# Patient Record
Sex: Female | Born: 1970 | Race: White | Hispanic: No | Smoking: Current every day smoker
Health system: Southern US, Community
[De-identification: ages and names within clinical notes are randomized; demographics above are authoritative.]

## PROBLEM LIST (undated history)

## (undated) DIAGNOSIS — D320 Benign neoplasm of cerebral meninges: Secondary | ICD-10-CM

## (undated) DIAGNOSIS — M549 Dorsalgia, unspecified: Secondary | ICD-10-CM

## (undated) DIAGNOSIS — M25529 Pain in unspecified elbow: Secondary | ICD-10-CM

## (undated) DIAGNOSIS — Z975 Presence of (intrauterine) contraceptive device: Secondary | ICD-10-CM

## (undated) DIAGNOSIS — E119 Type 2 diabetes mellitus without complications: Secondary | ICD-10-CM

## (undated) DIAGNOSIS — M51369 Other intervertebral disc degeneration, lumbar region without mention of lumbar back pain or lower extremity pain: Secondary | ICD-10-CM

## (undated) DIAGNOSIS — G43909 Migraine, unspecified, not intractable, without status migrainosus: Secondary | ICD-10-CM

## (undated) DIAGNOSIS — E785 Hyperlipidemia, unspecified: Secondary | ICD-10-CM

## (undated) DIAGNOSIS — M722 Plantar fascial fibromatosis: Secondary | ICD-10-CM

## (undated) HISTORY — PX: APPENDECTOMY: SHX54

## (undated) HISTORY — PX: CHOLECYSTECTOMY: SHX55

## (undated) HISTORY — PX: KNEE SURGERY: SHX244

## (undated) HISTORY — PX: BREAST SURGERY: SHX581

---

## 2017-07-11 ENCOUNTER — Emergency Department (INDEPENDENT_AMBULATORY_CARE_PROVIDER_SITE_OTHER)
Admission: EM | Admit: 2017-07-11 | Discharge: 2017-07-11 | Disposition: A | Discharge status: Home / Self Care | Payer: BLUE CROSS/BLUE SHIELD | Source: Home / Self Care | Attending: Family Medicine | Admitting: Family Medicine

## 2017-07-11 ENCOUNTER — Emergency Department (INDEPENDENT_AMBULATORY_CARE_PROVIDER_SITE_OTHER): Payer: BLUE CROSS/BLUE SHIELD

## 2017-07-11 ENCOUNTER — Encounter: Payer: Self-pay | Admitting: *Deleted

## 2017-07-11 ENCOUNTER — Other Ambulatory Visit: Payer: Self-pay

## 2017-07-11 DIAGNOSIS — B9789 Other viral agents as the cause of diseases classified elsewhere: Secondary | ICD-10-CM

## 2017-07-11 DIAGNOSIS — J069 Acute upper respiratory infection, unspecified: Secondary | ICD-10-CM | POA: Diagnosis not present

## 2017-07-11 DIAGNOSIS — R079 Chest pain, unspecified: Secondary | ICD-10-CM

## 2017-07-11 DIAGNOSIS — R05 Cough: Secondary | ICD-10-CM | POA: Diagnosis not present

## 2017-07-11 DIAGNOSIS — J9801 Acute bronchospasm: Secondary | ICD-10-CM

## 2017-07-11 DIAGNOSIS — M940 Chondrocostal junction syndrome [Tietze]: Secondary | ICD-10-CM

## 2017-07-11 HISTORY — DX: Hyperlipidemia, unspecified: E78.5

## 2017-07-11 MED ORDER — DOXYCYCLINE HYCLATE 100 MG PO CAPS: 100.0000 mg | ORAL_CAPSULE | Freq: Two times a day (BID) | ORAL | 0 refills | Status: DC

## 2017-07-11 MED ORDER — BENZONATATE 200 MG PO CAPS: ORAL_CAPSULE | ORAL | 0 refills | Status: DC

## 2017-07-11 MED ORDER — PREDNISONE 20 MG PO TABS: ORAL_TABLET | ORAL | 0 refills | Status: DC

## 2017-07-11 NOTE — ED Triage Notes (Signed)
Pt reports that she had URI at the beginning of October with fever, cough, and chills. Her s/s resolved except a persistent cough. She now c/o chest pain x 2 days.

## 2017-07-11 NOTE — Discharge Instructions (Signed)
Take plain guaifenesin (1200mg  extended release tabs such as Mucinex) twice daily, with plenty of water, for cough and congestion.  May add Pseudoephedrine (30mg , one or two every 4 to 6 hours) for sinus congestion.  Get adequate rest.   Try warm salt water gargles for sore throat.  Stop all antihistamines for now, and other non-prescription cough/cold preparations. May continue albuterol if needed for wheezing. Follow-up with family doctor if not improving about 7 to 10 days.

## 2017-07-11 NOTE — ED Provider Notes (Signed)
[facility]    CSN: [id number] Arrival date & time: 07/11/17  1749     History   Chief Complaint Chief Complaint  Patient presents with  . Cough    HPI Tracey Little is a 46 y.o. female.   Patient reports that she developed a flu-like respiratory illness about five weeks ago, treated initially with amoxicillin and then changed to azithromycin about two weeks ago.  She was feeling somewhat better until 4 days ago when she developed increased fatigue, sinus congestion, sore throat, chills, and soreness in her neck.  During the past week she has developed tightness in her anterior chest, and intermittent wheezing.  She has been using an albuterol inhaler with improvement in wheezing.  She has a family history of asthma (mother and brother).  She notes that her colds tend to linger.   The history is provided by the patient.    Past Medical History:  Diagnosis Date  . Hyperlipidemia     There are no active problems to display for this patient.   Past Surgical History:  Procedure Laterality Date  . APPENDECTOMY    . BREAST SURGERY    . CHOLECYSTECTOMY    . KNEE SURGERY      OB History    No data available       Home Medications    Prior to Admission medications   Medication Sig Start Date End Date Taking? Authorizing Provider  aspirin 81 MG chewable tablet Chew daily by mouth.   Yes [provider]  cholecalciferol (VITAMIN D) 1000 units tablet Take 1,000 Units daily by mouth.   Yes [provider]  clonazePAM (KLONOPIN) 1 MG tablet Take 1 mg 2 (two) times daily by mouth.   Yes [provider]  DULoxetine HCl (CYMBALTA PO) Take by mouth.   Yes [provider]  ibuprofen (ADVIL,MOTRIN) 200 MG tablet Take 200 mg every 6 (six) hours as needed by mouth.   Yes [provider]  Lansoprazole (PREVACID PO) Take by mouth.   Yes [provider]  vortioxetine HBr (TRINTELLIX) 20 MG TABS Take by mouth.   Yes  [provider]  benzonatate (TESSALON) 200 MG capsule Take one cap by mouth at bedtime as needed for cough.  May repeat in 4 to 6 hours 07/11/17   Lattie HawBeese, Shantanu Strauch A, MD  doxycycline (VIBRAMYCIN) 100 MG capsule Take 1 capsule (100 mg total) 2 (two) times daily by mouth. Take with food. 07/11/17   Lattie HawBeese, Teofilo Lupinacci A, MD  predniSONE (DELTASONE) 20 MG tablet Take one tab by mouth twice daily for 5 days, then one daily. Take with food. 07/11/17   Lattie HawBeese, Moorea Boissonneault A, MD    Family History Family History  Problem Relation Age of Onset  . Diabetes Mother   . Hyperlipidemia Mother   . Hypertension Mother   . Diabetes Father   . Diabetes Sister   . Hyperlipidemia Sister   . Hypertension Sister   . Diabetes Brother   . Hyperlipidemia Brother   . Hypertension Brother     Social History Social History   Tobacco Use  . Smoking status: Current Every Day Smoker    Packs/day: 1.00    Types: Cigarettes  . Smokeless tobacco: Never Used  Substance Use Topics  . Alcohol use: No    Frequency: Never  . Drug use: No     Allergies   Patient has no known allergies.   Review of Systems Review of Systems + sore throat +  cough No pleuritic pain, but complains of pain over sternum + wheezing + nasal congestion + post-nasal drainage No sinus pain/pressure No itchy/red eyes No earache No hemoptysis No SOB No fever, + chills No nausea No vomiting No abdominal pain No diarrhea No urinary symptoms No skin rash + fatigue No myalgias No headache Used OTC meds without relief   Physical Exam Triage Vital Signs ED Triage Vitals  Enc Vitals Group     BP 07/11/17 1817 134/84     Pulse Rate 07/11/17 1817 73     Resp 07/11/17 1817 18     Temp 07/11/17 1817 98.6 F (37 C)     Temp Source 07/11/17 1817 Oral     SpO2 07/11/17 1817 100 %     Weight 07/11/17 1818 188 lb (85.3 kg)     Height 07/11/17 1818  (1.651 m)     Head Circumference --      Peak Flow --      Pain Score  07/11/17 1818 0     Pain Loc --      Pain Edu? --      Excl. in GC? --    No data found.  Updated Vital Signs BP 134/84 (BP Location: Right Arm)   Pulse 73   Temp 98.6 F (37 C) (Oral)   Resp 18   Ht  (1.651 m)   Wt 188 lb (85.3 kg)   SpO2 100%   BMI 31.28 kg/m   Visual Acuity Right Eye Distance:   Left Eye Distance:   Bilateral Distance:    Right Eye Near:   Left Eye Near:    Bilateral Near:     Physical Exam Nursing notes and Vital Signs reviewed. Appearance:  Patient appears stated age, and in no acute distress Eyes:  Pupils are equal, round, and reactive to light and accomodation.  Extraocular movement is intact.  Conjunctivae are not inflamed  Ears:  Canals normal.  Tympanic membranes normal.  Nose:  Mildly congested turbinates.  No sinus tenderness.  Pharynx:  Normal Neck:  Supple.  Enlarged posterior/lateral nodes are palpated bilaterally, tender to palpation on the left.   Lungs:  Clear to auscultation.  Breath sounds are equal.  Moving air well. Chest:  Distinct tenderness to palpation over the mid-sternum.  Heart:  Regular rate and rhythm without murmurs, rubs, or gallops.  Abdomen:  Nontender without masses or hepatosplenomegaly.  Bowel sounds are present.  No CVA or flank tenderness.  Extremities:  No edema.  Skin:  No rash present.    UC Treatments / Results  Labs (all labs ordered are listed, but only abnormal results are displayed) Labs Reviewed - No data to display  EKG  EKG Interpretation None       Radiology Dg Chest 2 View  Result Date: 07/11/2017 CLINICAL DATA:  Cough for 1 month and chest pain for 2 days. EXAM: CHEST  2 VIEW COMPARISON:  None. FINDINGS: Cardiomediastinal silhouette is normal. Mediastinal contours appear intact. There is no evidence of focal airspace consolidation, pleural effusion or pneumothorax. Osseous structures are without acute abnormality. Soft tissues are grossly normal. IMPRESSION: No active cardiopulmonary  disease. Electronically Signed   By: Ted Mcalpine M.D.   On: 07/11/2017 18:29    Procedures Procedures (including critical care time)  Medications Ordered in UC Medications - No data to display   Initial Impression / Assessment and Plan / UC Course  I have reviewed the triage vital signs and the  nursing notes.  Pertinent labs & imaging results that were available during my care of the patient were reviewed by me and considered in my medical decision making (see chart for details).    With patient's history of seasonal rhinitis, family history of asthma, and past history of viral URI's that linger, she probably has a predisposition to mild reactive airways disease.  She now has congestion of a viral URI superimposed on her usual seasonal rhinitis.    Suspect present illness is a second viral URI Begin doxycycline for atypical coverage. Begin prednisone burst. Prescription written for Benzonatate (Tessalon) to take at bedtime for night-time cough.  Take plain guaifenesin (  extended release tabs such as Mucinex) twice daily, with plenty of water, for cough and congestion.  May add Pseudoephedrine ( , one or two every 4 to 6 hours) for sinus congestion.  Get adequate rest.   Try warm salt water gargles for sore throat.  Stop all antihistamines for now, and other non-prescription cough/cold preparations. May continue albuterol if needed for wheezing. Follow-up with family doctor if not improving about 7 to 10 days.     Final Clinical Impressions(s) / UC Diagnoses   Final diagnoses:  Viral URI with cough  Bronchospasm  Costochondritis    ED Discharge Orders        Ordered    predniSONE (DELTASONE) 20 MG tablet     07/11/17 1903    doxycycline (VIBRAMYCIN) 100 MG capsule  2 times daily     07/11/17 1903    benzonatate (TESSALON) 200 MG capsule     07/11/17 1903           Lattie Haw, MD 07/14/17 1124

## 2018-06-06 IMAGING — DX DG CHEST 2V
2 series · 2 of 2 positions shown · non-contrast
Comparison: None.

CLINICAL DATA: Cough for 1 month and chest pain for 2 days.

EXAM:
CHEST  2 VIEW

[chest pa]
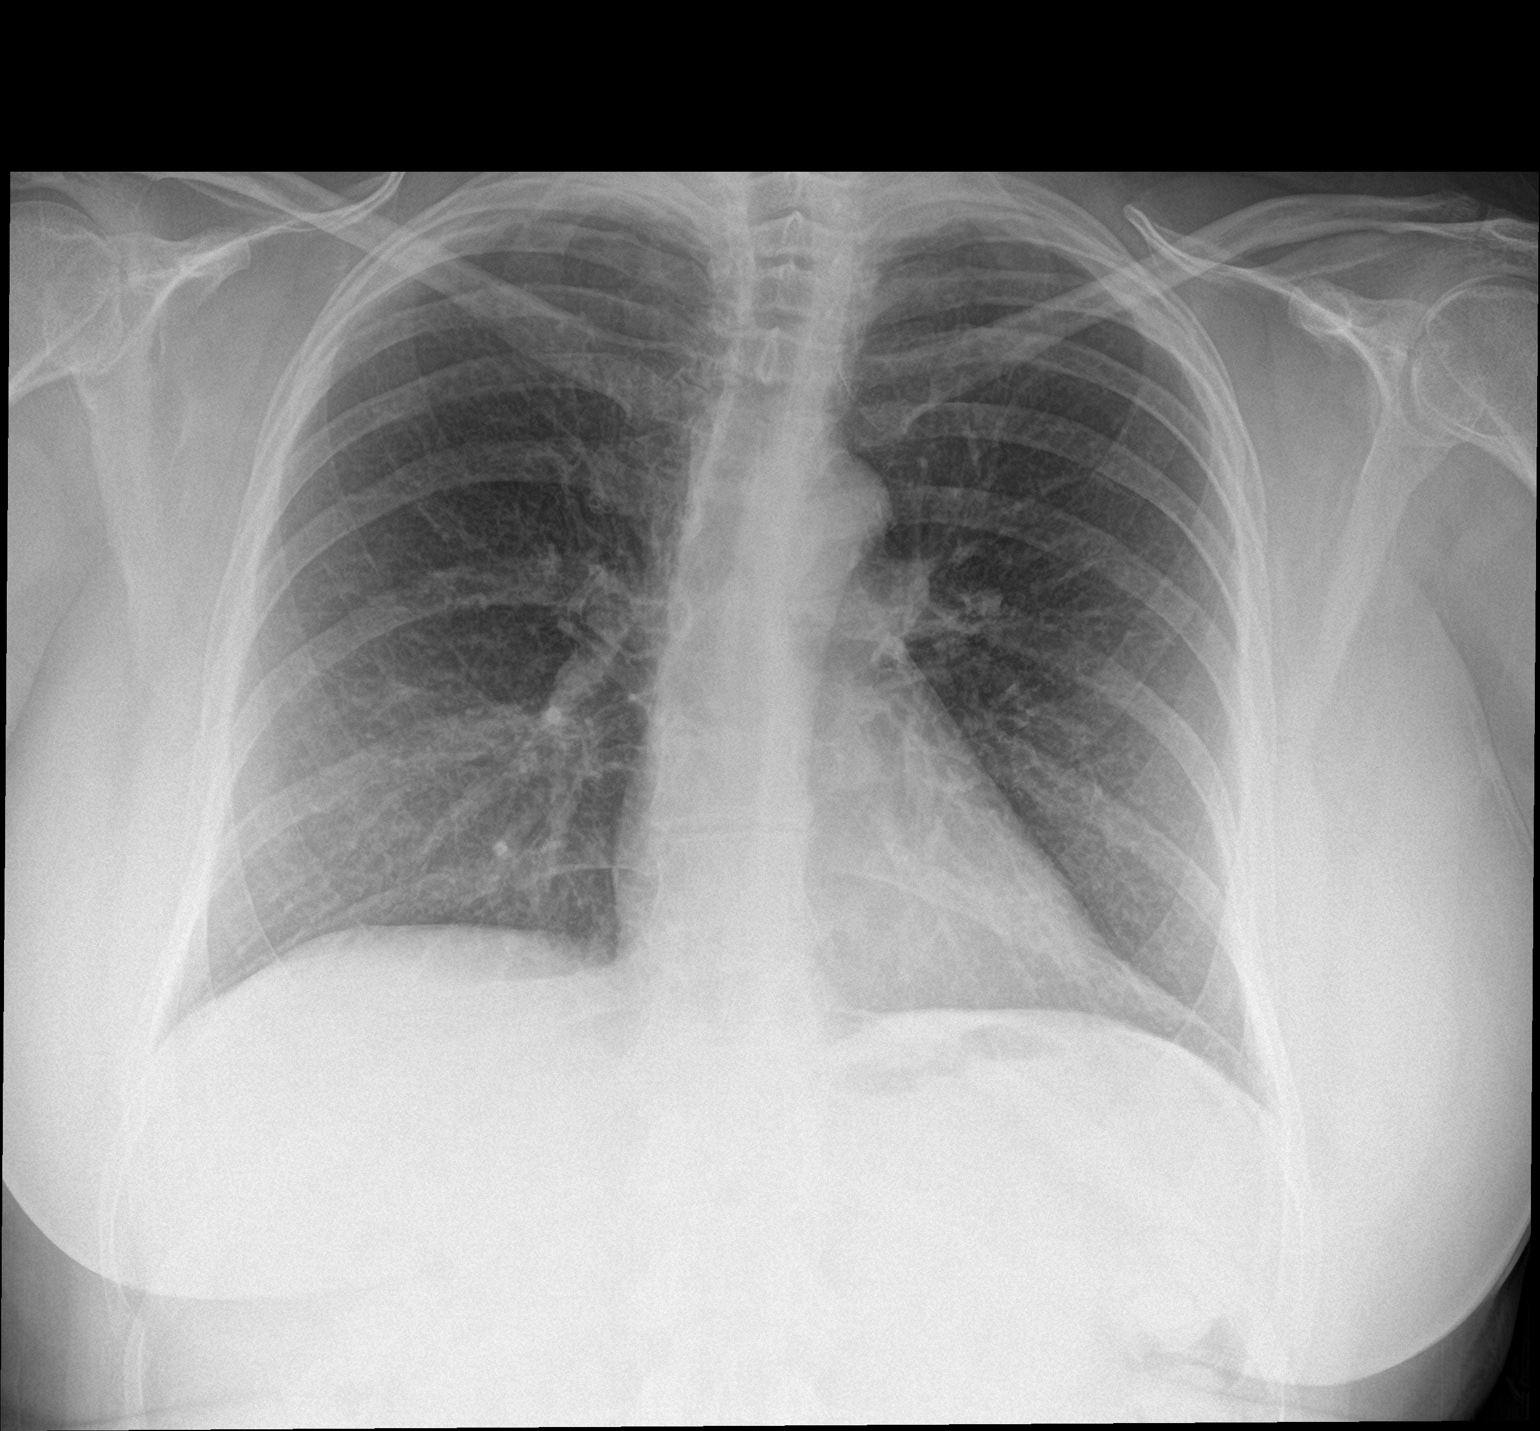

[chest lat]
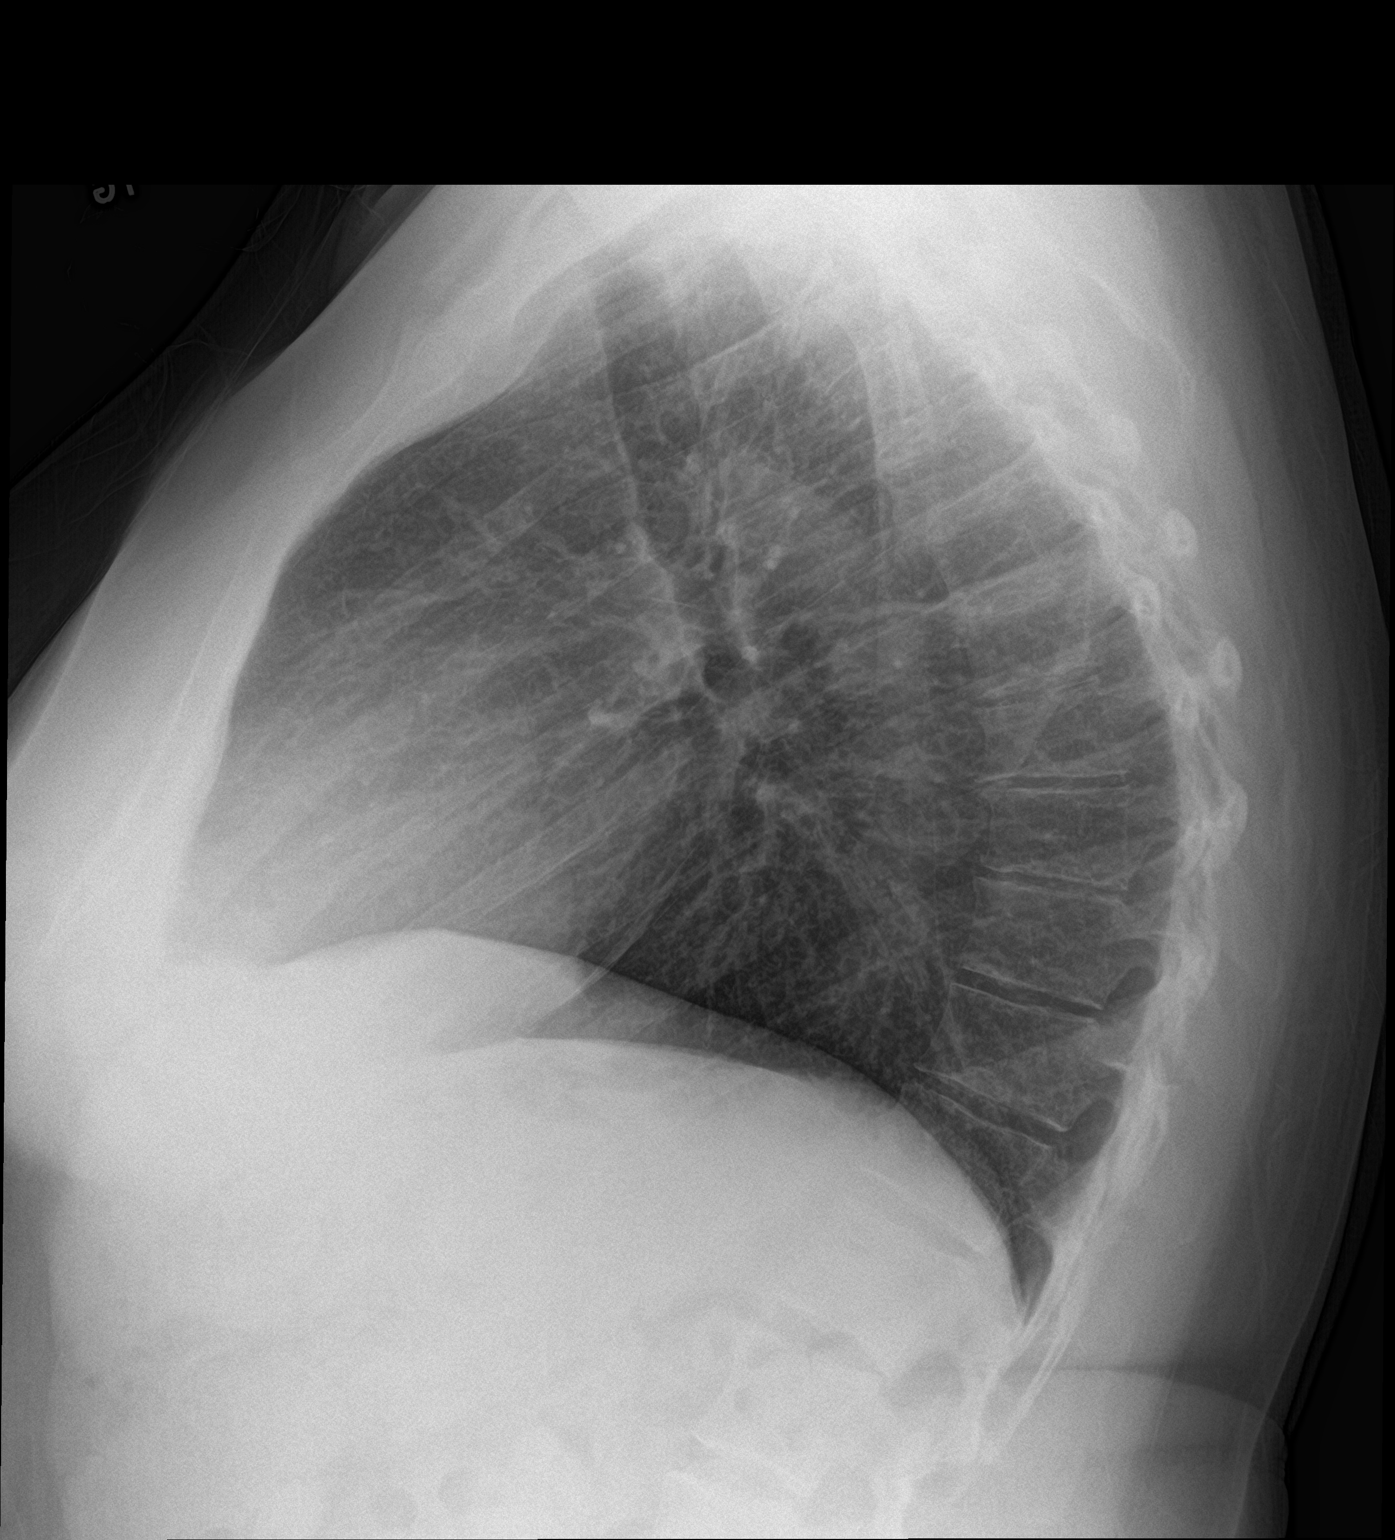

[2 of 2 positions shown; findings below may reference images not displayed]

FINDINGS: Cardiomediastinal silhouette is normal. Mediastinal contours appear
intact.

There is no evidence of focal airspace consolidation, pleural
effusion or pneumothorax.

Osseous structures are without acute abnormality. Soft tissues are
grossly normal.
IMPRESSION: No active cardiopulmonary disease.

## 2020-03-09 ENCOUNTER — Emergency Department (INDEPENDENT_AMBULATORY_CARE_PROVIDER_SITE_OTHER)
Admission: RE | Admit: 2020-03-09 | Discharge: 2020-03-09 | Disposition: A | Discharge status: Home / Self Care | Payer: BC Managed Care – PPO | Source: Ambulatory Visit

## 2020-03-09 ENCOUNTER — Other Ambulatory Visit: Payer: Self-pay

## 2020-03-09 ENCOUNTER — Emergency Department
Admission: EM | Admit: 2020-03-09 | Discharge: 2020-03-09 | Discharge status: Absent without leave | Payer: BLUE CROSS/BLUE SHIELD | Source: Home / Self Care

## 2020-03-09 VITALS — BP 106/72 | HR 88 | Temp 98.6°F | Resp 16

## 2020-03-09 DIAGNOSIS — M545 Low back pain, unspecified: Secondary | ICD-10-CM

## 2020-03-09 LAB — POCT URINALYSIS DIP (MANUAL ENTRY)
Bilirubin, UA: NEGATIVE
Glucose, UA: NEGATIVE mg/dL
Ketones, POC UA: NEGATIVE mg/dL
Leukocytes, UA: NEGATIVE
Nitrite, UA: NEGATIVE
Protein Ur, POC: NEGATIVE mg/dL
Spec Grav, UA: >=1.03 — AB (ref 1.010–1.025)
Urobilinogen, UA: 0.2 E.U./dL
pH, UA: 6.5 (ref 5.0–8.0)

## 2020-03-09 MED ORDER — CYCLOBENZAPRINE HCL 5 MG PO TABS: 5.0000 mg | ORAL_TABLET | Freq: Two times a day (BID) | ORAL | 0 refills | Status: DC | PRN

## 2020-03-09 MED ORDER — HYDROCODONE-ACETAMINOPHEN 5-325 MG PO TABS: 1.0000 | ORAL_TABLET | Freq: Three times a day (TID) | ORAL | 0 refills | Status: DC | PRN

## 2020-03-09 MED ORDER — PREDNISONE 50 MG PO TABS: 50.0000 mg | ORAL_TABLET | Freq: Every day | ORAL | 0 refills | Status: AC

## 2020-03-09 MED ORDER — METHYLPREDNISOLONE SODIUM SUCC 40 MG IJ SOLR
80.0000 mg | Freq: Once | INTRAMUSCULAR | Status: AC
  Administered 2020-03-09: 15:00:00 80 mg via INTRAMUSCULAR

## 2020-03-09 NOTE — Discharge Instructions (Signed)
  You were given a shot of solumedrol (a steroid) today to help with muscle pain and swelling.  You have been prescribed prednisone, an oral steroid.  You may start this medication tomorrow with breakfast.    Norco/Vicodin (hydrocodone-acetaminophen) is a narcotic pain medication, do not combine these medications with others containing tylenol. While taking, do not drink alcohol, drive, or perform any other activities that requires focus while taking these medications.   Call to schedule a follow up appointment with family medicine or sports medicine later this week if not improving.

## 2020-03-09 NOTE — ED Provider Notes (Signed)
[facility]    CSN: [id number] Arrival date & time: 03/09/20  1444      History   Chief Complaint Chief Complaint  Patient presents with   Appointment    3:00   Back Pain    HPI Tracey Little is a 49 y.o. female.   HPI Tracey Little is a 49 y.o. female presenting to UC with c/o bilateral low back pain that started yesterday with tingling up her spine, the tingling has resolved but she woke with low back pain that is aching and sharp, 6/10, worse with certain movements. Pain radiating into lower abdomen with cramping. No recent injuries but does report sleeping "a lot" the other day.  She also reports hx of bulging discs.  She has not had back problems for about 5-6 years.  Denies radiation of pain or numbness in arms or legs. No urinary symptoms. No hx of kidney stones.    Past Medical History:  Diagnosis Date   Hyperlipidemia     There are no problems to display for this patient.   Past Surgical History:  Procedure Laterality Date   APPENDECTOMY     BREAST SURGERY     CHOLECYSTECTOMY     KNEE SURGERY      OB History   No obstetric history on file.      Home Medications    Prior to Admission medications   Medication Sig Start Date End Date Taking? Authorizing Provider  atorvastatin (LIPITOR) 20 MG tablet Take 20 mg by mouth daily.   Yes [provider]  ibuprofen (ADVIL,MOTRIN) 200 MG tablet Take 200 mg every 6 (six) hours as needed by mouth.   Yes [provider]  sertraline (ZOLOFT) 50 MG tablet Take 50 mg by mouth daily.   Yes [provider]  aspirin 81 MG chewable tablet Chew daily by mouth.    [provider]  benzonatate (TESSALON) 200 MG capsule Take one cap by mouth at bedtime as needed for cough.  May repeat in 4 to 6 hours 07/11/17   Lattie Haw, MD  cholecalciferol (VITAMIN D) 1000 units tablet Take 1,000 Units daily by mouth.    [provider]  clonazePAM (KLONOPIN) 1 MG  tablet Take 1 mg 2 (two) times daily by mouth.    [provider]  cyclobenzaprine (FLEXERIL) 5 MG tablet Take 1-2 tablets (5-10 mg total) by mouth 2 (two) times daily as needed for muscle spasms. 03/09/20   Lurene Shadow, PA-C  doxycycline (VIBRAMYCIN) 100 MG capsule Take 1 capsule (100 mg total) 2 (two) times daily by mouth. Take with food. 07/11/17   Lattie Haw, MD  DULoxetine HCl (CYMBALTA PO) Take by mouth.    [provider]  HYDROcodone-acetaminophen (NORCO/VICODIN) 5-325 MG tablet Take 1 tablet by mouth every 8 (eight) hours as needed. 03/09/20   Lurene Shadow, PA-C  Lansoprazole (PREVACID PO) Take by mouth.    [provider]  predniSONE (DELTASONE) 50 MG tablet Take 1 tablet (50 mg total) by mouth daily with breakfast for 5 days. 03/09/20 03/14/20  Lurene Shadow, PA-C  vortioxetine HBr (TRINTELLIX) 20 MG TABS Take by mouth.    [provider]    Family History Family History  Problem Relation Age of Onset   Diabetes Mother    Hyperlipidemia Mother    Hypertension Mother    Diabetes Father    Heart failure Father    Heart attack Father    Diabetes Sister  Hyperlipidemia Sister    Hypertension Sister    Diabetes Brother    Hyperlipidemia Brother    Hypertension Brother     Social History Social History   Tobacco Use   Smoking status: Current Every Day Smoker    Packs/day: 1.00    Types: Cigarettes   Smokeless tobacco: Never Used  Vaping Use   Vaping Use: Never used  Substance Use Topics   Alcohol use: No   Drug use: No     Allergies   Patient has no known allergies.   Review of Systems Review of Systems  Gastrointestinal: Negative for diarrhea, nausea and vomiting.  Genitourinary: Negative for dysuria, frequency and hematuria.  Musculoskeletal: Positive for back pain and myalgias.     Physical Exam Triage Vital Signs ED Triage Vitals  Enc Vitals Group     BP 03/09/20 1457 106/72     Pulse Rate  03/09/20 1457 88     Resp 03/09/20 1457 16     Temp 03/09/20 1457 98.6 F (37 C)     Temp Source 03/09/20 1457 Oral     SpO2 03/09/20 1457 97 %     Weight --      Height --      Head Circumference --      Peak Flow --      Pain Score 03/09/20 1454 6     Pain Loc --      Pain Edu? --      Excl. in GC? --    No data found.  Updated Vital Signs BP 106/72 (BP Location: Right Arm)    Pulse 88    Temp 98.6 F (37 C) (Oral)    Resp 16    SpO2 97%   Visual Acuity Right Eye Distance:   Left Eye Distance:   Bilateral Distance:    Right Eye Near:   Left Eye Near:    Bilateral Near:     Physical Exam Vitals and nursing note reviewed.  Constitutional:      Appearance: Normal appearance. She is well-developed.  HENT:     Head: Normocephalic and atraumatic.  Cardiovascular:     Rate and Rhythm: Normal rate and regular rhythm.  Pulmonary:     Effort: Pulmonary effort is normal. No respiratory distress.     Breath sounds: Normal breath sounds.  Musculoskeletal:        General: Tenderness present. Normal range of motion.     Cervical back: Normal range of motion.       Back:     Comments: Full ROM upper and lower extremities.  Negative straight leg raise. 5/5 strength in lower extremities bilaterally. Increased pain with rotation and flexion of the hip. Normal gait.   Skin:    General: Skin is warm and dry.  Neurological:     Mental Status: She is alert and oriented to person, place, and time.  Psychiatric:        Behavior: Behavior normal.      UC Treatments / Results  Labs (all labs ordered are listed, but only abnormal results are displayed) Labs Reviewed  POCT URINALYSIS DIP (MANUAL ENTRY) - Abnormal; Notable for the following components:      Result Value   Clarity, UA cloudy (*)    Spec Grav, UA >=1.030 (*)    Blood, UA trace-lysed (*)    All other components within normal limits    EKG   Radiology No results found.  Procedures Procedures (including  critical  care time)  Medications Ordered in UC Medications  methylPREDNISolone sodium succinate (SOLU-MEDROL) 40 mg/mL injection 80 mg (80 mg Intramuscular Given 03/09/20 1522)    Initial Impression / Assessment and Plan / UC Course  I have reviewed the triage vital signs and the nursing notes.  Pertinent labs & imaging results that were available during my care of the patient were reviewed by me and considered in my medical decision making (see chart for details).     Muscular tenderness on exam. No red flag symptoms No reports of trauma Will hold off on urgent imaging and tx conservatively  Encouraged f/u with PCP or Sports Medicine later this week if not improving AVS given   Final Clinical Impressions(s) / UC Diagnoses   Final diagnoses:  Acute bilateral low back pain without sciatica     Discharge Instructions      You were given a shot of solumedrol (a steroid) today to help with muscle pain and swelling.  You have been prescribed prednisone, an oral steroid.  You may start this medication tomorrow with breakfast.    Norco/Vicodin (hydrocodone-acetaminophen) is a narcotic pain medication, do not combine these medications with others containing tylenol. While taking, do not drink alcohol, drive, or perform any other activities that requires focus while taking these medications.   Call to schedule a follow up appointment with family medicine or sports medicine later this week if not improving.     ED Prescriptions    Medication Sig Dispense Auth. Provider   cyclobenzaprine (FLEXERIL) 5 MG tablet Take 1-2 tablets (5-10 mg total) by mouth 2 (two) times daily as needed for muscle spasms. 30 tablet Doroteo Glassman, Audriana Aldama O, PA-C   predniSONE (DELTASONE) 50 MG tablet Take 1 tablet (50 mg total) by mouth daily with breakfast for 5 days. 5 tablet Lurene Shadow, PA-C   HYDROcodone-acetaminophen (NORCO/VICODIN) 5-325 MG tablet Take 1 tablet by mouth every 8 (eight) hours as needed. 6  tablet Lurene Shadow, [place]     I have reviewed the PDMP during this encounter.   Lurene Shadow, [place] 03/09/20 [phone]

## 2020-03-09 NOTE — ED Triage Notes (Signed)
Patient presents to Urgent Care with complaints of lower back pain that radiates tingles up her back since yesterday. Patient reports the tingling has gone away and now she has lowe abdominal cramping near her bladder. Pt states she has bulging discs, has not had problems w/ it in years.

## 2022-01-23 ENCOUNTER — Emergency Department
Admission: RE | Admit: 2022-01-23 | Discharge: 2022-01-23 | Disposition: A | Discharge status: Home / Self Care | Payer: BC Managed Care – PPO | Source: Ambulatory Visit

## 2022-01-23 VITALS — BP 123/84 | HR 76 | Temp 98.5°F | Resp 16

## 2022-01-23 DIAGNOSIS — J309 Allergic rhinitis, unspecified: Secondary | ICD-10-CM | POA: Diagnosis not present

## 2022-01-23 DIAGNOSIS — R059 Cough, unspecified: Secondary | ICD-10-CM

## 2022-01-23 MED ORDER — FEXOFENADINE HCL 180 MG PO TABS: 180.0000 mg | ORAL_TABLET | Freq: Every day | ORAL | 0 refills | Status: DC

## 2022-01-23 MED ORDER — PROMETHAZINE-DM 6.25-15 MG/5ML PO SYRP: 5.0000 mL | ORAL_SOLUTION | Freq: Two times a day (BID) | ORAL | 0 refills | Status: DC | PRN

## 2022-01-23 MED ORDER — BENZONATATE 200 MG PO CAPS: 200.0000 mg | ORAL_CAPSULE | Freq: Three times a day (TID) | ORAL | 0 refills | Status: AC | PRN

## 2022-01-23 NOTE — ED Triage Notes (Signed)
Patient presents to Urgent Care with complaints of earache, nasal congestion/fullness since 4 days ago. Patient reports being seen at PCP on Friday negative covid test, negative flu. Started Doxycycline and Prednisone. Taking Mucinex for sinuses.

## 2022-01-23 NOTE — ED Provider Notes (Signed)
[facility]    CSN: [id number] Arrival date & time: 01/23/22  1513      History   Chief Complaint Chief Complaint  Patient presents with   Cough   Appointment    3PM    HPI Tracey Little is a 51 y.o. female.   HPI Pleasant 51 year old female presents with sinus nasal congestion and cough for 4 days.  PMH significant for HLD and nicotine dependence.  Patient was evaluated by her PCP on 01/21/22 for productive cough and prescribed Doxycycline and prednisone.  Reports to continue to take this medication however symptoms have worsened over the past 2 days.  Patient reports postnasal drainage is worse.  Past Medical History:  Diagnosis Date   Hyperlipidemia     There are no problems to display for this patient.   Past Surgical History:  Procedure Laterality Date   APPENDECTOMY     BREAST SURGERY     CHOLECYSTECTOMY     KNEE SURGERY      OB History   No obstetric history on file.      Home Medications    Prior to Admission medications   Medication Sig Start Date End Date Taking? Authorizing Provider  aspirin 81 MG chewable tablet Chew daily by mouth.   Yes [provider]  atorvastatin (LIPITOR) 20 MG tablet Take 20 mg by mouth daily.   Yes [provider]  benzonatate (TESSALON) 200 MG capsule Take 1 capsule (200 mg total) by mouth 3 (three) times daily as needed for up to 7 days for cough. 01/23/22 01/30/22 Yes Trevor Iha, FNP  cholecalciferol (VITAMIN D) 1000 units tablet Take 1,000 Units daily by mouth.   Yes [provider]  clonazePAM (KLONOPIN) 1 MG tablet Take 1 mg 2 (two) times daily by mouth.   Yes [provider]  doxycycline (VIBRAMYCIN) 100 MG capsule Take 1 capsule (100 mg total) 2 (two) times daily by mouth. Take with food. 07/11/17  Yes Lattie Haw, MD  DULoxetine HCl (CYMBALTA PO) Take by mouth.   Yes [provider]  fexofenadine (ALLEGRA ALLERGY) 180 MG tablet Take 1 tablet (180 mg  total) by mouth daily for 15 days. 01/23/22 02/07/22 Yes Trevor Iha, FNP  Lansoprazole (PREVACID PO) Take by mouth.   Yes [provider]  predniSONE (DELTASONE) 20 MG tablet Take 20 mg by mouth 2 (two) times daily. 01/21/22  Yes [provider]  promethazine-dextromethorphan (PROMETHAZINE-DM) 6.25-15 MG/5ML syrup Take 5 mLs by mouth 2 (two) times daily as needed for cough. 01/23/22  Yes Trevor Iha, FNP  sertraline (ZOLOFT) 50 MG tablet Take 50 mg by mouth daily.   Yes [provider]  vortioxetine HBr (TRINTELLIX) 20 MG TABS tablet Take by mouth.   Yes [provider]  cyclobenzaprine (FLEXERIL) 5 MG tablet Take 1-2 tablets (5-10 mg total) by mouth 2 (two) times daily as needed for muscle spasms. 03/09/20   Lurene Shadow, PA-C  HYDROcodone-acetaminophen (NORCO/VICODIN) 5-325 MG tablet Take 1 tablet by mouth every 8 (eight) hours as needed. 03/09/20   Lurene Shadow, PA-C  ibuprofen (ADVIL,MOTRIN) 200 MG tablet Take 200 mg every 6 (six) hours as needed by mouth.    [provider]    Family History Family History  Problem Relation Age of Onset   Diabetes Mother    Hyperlipidemia Mother    Hypertension Mother    Diabetes Father    Heart failure Father    Heart attack Father  Diabetes Sister    Hyperlipidemia Sister    Hypertension Sister    Diabetes Brother    Hyperlipidemia Brother    Hypertension Brother     Social History Social History   Tobacco Use   Smoking status: Every Day    Packs/day: 1.00    Types: Cigarettes   Smokeless tobacco: Never  Vaping Use   Vaping Use: Never used  Substance Use Topics   Alcohol use: No   Drug use: No     Allergies   Patient has no known allergies.   Review of Systems Review of Systems  HENT:  Positive for congestion.   All other systems reviewed and are negative.   Physical Exam Triage Vital Signs ED Triage Vitals  Enc Vitals Group     BP      Pulse      Resp      Temp       Temp src      SpO2      Weight      Height      Head Circumference      Peak Flow      Pain Score      Pain Loc      Pain Edu?      Excl. in [place]?    No data found.  Updated Vital Signs BP 123/84 (BP Location: Right Arm)   Pulse 76   Temp 98.5 F (36.9 C) (Oral)   Resp 16   SpO2 94%       Physical Exam Vitals and nursing note reviewed.  Constitutional:      Appearance: Normal appearance. She is obese. She is ill-appearing.  HENT:     Head: Normocephalic and atraumatic.     Right Ear: Tympanic membrane, ear canal and external ear normal.     Left Ear: Tympanic membrane, ear canal and external ear normal.     Mouth/Throat:     Mouth: Mucous membranes are moist.     Pharynx: Oropharynx is clear.     Comments: Significant amount of clear drainage of posterior oropharynx noted Eyes:     Extraocular Movements: Extraocular movements intact.     Conjunctiva/sclera: Conjunctivae normal.     Pupils: Pupils are equal, round, and reactive to light.  Cardiovascular:     Rate and Rhythm: Normal rate and regular rhythm.     Pulses: Normal pulses.     Heart sounds: Normal heart sounds.  Pulmonary:     Effort: Pulmonary effort is normal.     Breath sounds: Normal breath sounds. No wheezing or rhonchi.     Comments: Infrequent nonproductive cough noted on exam Musculoskeletal:     Cervical back: Normal range of motion and neck supple.  Skin:    General: Skin is warm and dry.  Neurological:     General: No focal deficit present.     Mental Status: She is alert and oriented to person, place, and time.     UC Treatments / Results  Labs (all labs ordered are listed, but only abnormal results are displayed) Labs Reviewed - No data to display  EKG   Radiology No results found.  Procedures Procedures (including critical care time)  Medications Ordered in UC Medications - No data to display  Initial Impression / Assessment and Plan / UC Course  I have reviewed the  triage vital signs and the nursing notes.  Pertinent labs & imaging results that were available during my care of  the patient were reviewed by me and considered in my medical decision making (see chart for details).     MDM: 1.  Cough-Rx'd Tessalon Perles, Promethazine DM; 2.  Allergic rhinitis-Rx'd Allegra. Instructed patient to take medication along with previously prescribed medication from PCP earlier this week.  Advised patient to take Allegra daily with Doxycycline for the next 7 days.  May take as needed afterwards for concurrent postnasal drainage/drip.  Advised patient may use Tessalon Perles or promethazine DM for cough advised patient not to use these medications together.  Advised patient of sedate of effects of Promethazine DM. Encouraged patient increase daily water intake while taking these medications.  Advised patient if symptoms worsen and/or unresolved please follow-up with PCP or here for further evaluation.  Work note provided to patient prior to discharge.  Patient discharged home, hemodynamically stable. Final Clinical Impressions(s) / UC Diagnoses   Final diagnoses:  Cough, unspecified type  Allergic rhinitis, unspecified seasonality, unspecified trigger     Discharge Instructions      Instructed patient to take medication along with previously prescribed medication from PCP earlier this week.  Advised patient to take Allegra daily with Doxycycline for the next 7 days.  May take as needed afterwards for concurrent postnasal drainage/drip.  Advised patient may use Tessalon Perles or promethazine DM for cough advised patient not to use these medications together.  Advised patient of sedate of effects of Promethazine DM. Encouraged patient increase daily water intake while taking these medications.  Advised patient if symptoms worsen and/or unresolved please follow-up with PCP or here for further evaluation.     ED Prescriptions     Medication Sig Dispense Auth. Provider    benzonatate (TESSALON) 200 MG capsule Take 1 capsule (200 mg total) by mouth 3 (three) times daily as needed for up to 7 days for cough. 40 capsule Trevor Iha, FNP   fexofenadine [facility] ALLERGY) 180 MG tablet Take 1 tablet (180 mg total) by mouth daily for 15 days. 15 tablet Trevor Iha, FNP   promethazine-dextromethorphan (PROMETHAZINE-DM) 6.25-15 MG/5ML syrup Take 5 mLs by mouth 2 (two) times daily as needed for cough. 118 mL Trevor Iha, FNP      PDMP not reviewed this encounter.   Trevor Iha, FNP 01/23/22 1620

## 2022-01-23 NOTE — Discharge Instructions (Addendum)
Instructed patient to take medication along with previously prescribed medication from PCP earlier this week.  Advised patient to take Allegra daily with Doxycycline for the next 7 days.  May take as needed afterwards for concurrent postnasal drainage/drip.  Advised patient may use Tessalon Perles or promethazine DM for cough advised patient not to use these medications together.  Advised patient of sedate of effects of Promethazine DM. Encouraged patient increase daily water intake while taking these medications.  Advised patient if symptoms worsen and/or unresolved please follow-up with PCP or here for further evaluation.

## 2022-06-04 ENCOUNTER — Ambulatory Visit
Admission: RE | Admit: 2022-06-04 | Discharge: 2022-06-04 | Disposition: A | Discharge status: Home / Self Care | Payer: BC Managed Care – PPO | Source: Ambulatory Visit | Attending: Family Medicine | Admitting: Family Medicine

## 2022-06-04 VITALS — BP 123/79 | HR 66 | Temp 99.4°F | Resp 18 | Ht 65.0 in | Wt 190.0 lb

## 2022-06-04 DIAGNOSIS — J209 Acute bronchitis, unspecified: Secondary | ICD-10-CM | POA: Diagnosis not present

## 2022-06-04 MED ORDER — AZITHROMYCIN 250 MG PO TABS: ORAL_TABLET | ORAL | 0 refills | Status: DC

## 2022-06-04 MED ORDER — PREDNISONE 20 MG PO TABS: 40.0000 mg | ORAL_TABLET | Freq: Every day | ORAL | 0 refills | Status: DC

## 2022-06-04 NOTE — ED Provider Notes (Signed)
[facility]    CSN: [id number] Arrival date & time: 06/04/22  0945      History   Chief Complaint Chief Complaint  Patient presents with   Cough    Entered by patient   Appointment    HPI Tracey Little is a 51 y.o. female.   HPI  Patient states she is a smoker.  She has allergies.  Prone to bronchitis.  States that she has been sick for 6 to 7 days.  Not getting better.  Was seen at an urgent care where strep and COVID testing were negative.  She has been treating this with over-the-counter medicines.  She still symptomatic with congestion, cough, postnasal drip, sinus pressure and pain, scratchy throat.  Past Medical History:  Diagnosis Date   Hyperlipidemia     There are no problems to display for this patient.   Past Surgical History:  Procedure Laterality Date   APPENDECTOMY     BREAST SURGERY     CHOLECYSTECTOMY     KNEE SURGERY      OB History   No obstetric history on file.      Home Medications    Prior to Admission medications   Medication Sig Start Date End Date Taking? Authorizing Provider  aspirin 81 MG chewable tablet Chew daily by mouth.   Yes [provider]  atorvastatin (LIPITOR) 20 MG tablet Take 20 mg by mouth daily.   Yes [provider]  azithromycin (ZITHROMAX Z-PAK) 250 MG tablet Take two pills today followed by one a day until gone 06/04/22  Yes Eustace Moore, MD  cholecalciferol (VITAMIN D) 1000 units tablet Take 1,000 Units daily by mouth.   Yes [provider]  clonazePAM (KLONOPIN) 1 MG tablet Take 1 mg 2 (two) times daily by mouth.   Yes [provider]  ibuprofen (ADVIL,MOTRIN) 200 MG tablet Take 200 mg every 6 (six) hours as needed by mouth.   Yes [provider]  predniSONE (DELTASONE) 20 MG tablet Take 2 tablets (40 mg total) by mouth daily with breakfast. 06/04/22  Yes Eustace Moore, MD  sertraline (ZOLOFT) 50 MG tablet Take 50 mg by mouth daily.   Yes  [provider]    Family History Family History  Problem Relation Age of Onset   Diabetes Mother    Hyperlipidemia Mother    Hypertension Mother    Diabetes Father    Heart failure Father    Heart attack Father    Diabetes Sister    Hyperlipidemia Sister    Hypertension Sister    Diabetes Brother    Hyperlipidemia Brother    Hypertension Brother     Social History Social History   Tobacco Use   Smoking status: Every Day    Packs/day: 1.00    Types: Cigarettes   Smokeless tobacco: Never  Vaping Use   Vaping Use: Never used  Substance Use Topics   Alcohol use: No   Drug use: No     Allergies   Patient has no known allergies.   Review of Systems Review of Systems See HPI  Physical Exam Triage Vital Signs ED Triage Vitals  Enc Vitals Group     BP 06/04/22 1044 123/79     Pulse Rate 06/04/22 1044 66     Resp 06/04/22 1044 18     Temp 06/04/22 1044 99.4 F (37.4 C)     Temp Source 06/04/22 1044 Oral     SpO2 06/04/22 1044 95 %  Weight 06/04/22 1046 190 lb (86.2 kg)     Height 06/04/22 1046 5\' 5"  (1.651 m)     Head Circumference --      Peak Flow --      Pain Score 06/04/22 1046 4     Pain Loc --      Pain Edu? --      Excl. in [place]? --    No data found.  Updated Vital Signs BP 123/79 (BP Location: Left Arm)   Pulse 66   Temp 99.4 F (37.4 C) (Oral)   Resp 18   Ht 5\' 5"  (1.651 m)   Wt 86.2 kg   SpO2 95%   BMI 31.62 kg/m       Physical Exam Constitutional:      General: She is not in acute distress.    Appearance: She is well-developed. She is ill-appearing.  HENT:     Head: Normocephalic and atraumatic.     Right Ear: Tympanic membrane and external ear normal.     Left Ear: Tympanic membrane and ear canal normal.     Nose: Congestion present.     Mouth/Throat:     Pharynx: Posterior oropharyngeal erythema present.  Eyes:     Conjunctiva/sclera: Conjunctivae normal.     Pupils: Pupils are equal, round, and reactive to  light.  Cardiovascular:     Rate and Rhythm: Normal rate and regular rhythm.     Heart sounds: Normal heart sounds.  Pulmonary:     Effort: Pulmonary effort is normal. No respiratory distress.     Breath sounds: Rhonchi present.  Abdominal:     General: There is no distension.     Palpations: Abdomen is soft.  Musculoskeletal:        General: Normal range of motion.     Cervical back: Normal range of motion.  Lymphadenopathy:     Cervical: Cervical adenopathy present.  Skin:    General: Skin is warm and dry.  Neurological:     Mental Status: She is alert.  Psychiatric:        Mood and Affect: Mood normal.        Behavior: Behavior normal.      UC Treatments / Results  Labs (all labs ordered are listed, but only abnormal results are displayed) Labs Reviewed - No data to display  EKG   Radiology No results found.  Procedures Procedures (including critical care time)  Medications Ordered in UC Medications - No data to display  Initial Impression / Assessment and Plan / UC Course  I have reviewed the triage vital signs and the nursing notes.  Pertinent labs & imaging results that were available during my care of the patient were reviewed by me and considered in my medical decision making (see chart for details).     Final Clinical Impressions(s) / UC Diagnoses   Final diagnoses:  Acute bronchitis, unspecified organism     Discharge Instructions      Make sure that you are drinking lots of fluids Take the azithromycin as directed.  2 pills today and then 1 a day until gone Take prednisone once a day for 5 days Continue Tessalon for cough Follow-up if you fail to improve by next week   ED Prescriptions     Medication Sig Dispense Auth. Provider   predniSONE (DELTASONE) 20 MG tablet Take 2 tablets (40 mg total) by mouth daily with breakfast. 10 tablet Raylene Everts, MD   azithromycin (ZITHROMAX Z-PAK) 250 MG  tablet Take two pills today followed by  one a day until gone 6 tablet Delton See Letta Pate, MD      PDMP not reviewed this encounter.   Eustace Moore, MD 06/04/22 1125

## 2022-06-04 NOTE — ED Triage Notes (Signed)
Patient c/o head congestion, productive cough, nasal drainage, bilateral ear pain x 6 days.  Patient was seen on Monday by another UC and diagnosed with pharyngitis.  No better.

## 2022-06-04 NOTE — Discharge Instructions (Signed)
Make sure that you are drinking lots of fluids Take the azithromycin as directed.  2 pills today and then 1 a day until gone Take prednisone once a day for 5 days Continue Tessalon for cough Follow-up if you fail to improve by next week

## 2022-06-05 ENCOUNTER — Telehealth: Payer: Self-pay | Admitting: Emergency Medicine

## 2022-06-05 NOTE — Telephone Encounter (Signed)
Spoke with patient, states that she is doing better.  Will continue to rest and push plenty of fluids.  Any changes she will contact the office.

## 2024-06-29 ENCOUNTER — Other Ambulatory Visit: Payer: Self-pay

## 2024-06-29 ENCOUNTER — Ambulatory Visit
Admission: RE | Admit: 2024-06-29 | Discharge: 2024-06-29 | Disposition: A | Discharge status: Home / Self Care | Source: Ambulatory Visit | Attending: Family Medicine | Admitting: Family Medicine

## 2024-06-29 VITALS — BP 106/70 | HR 77 | Temp 97.9°F | Resp 16

## 2024-06-29 DIAGNOSIS — S39012A Strain of muscle, fascia and tendon of lower back, initial encounter: Secondary | ICD-10-CM

## 2024-06-29 DIAGNOSIS — M5136 Other intervertebral disc degeneration, lumbar region with discogenic back pain only: Secondary | ICD-10-CM

## 2024-06-29 HISTORY — DX: Pain in unspecified elbow: M25.529

## 2024-06-29 HISTORY — DX: Type 2 diabetes mellitus without complications: E11.9

## 2024-06-29 HISTORY — DX: Other intervertebral disc degeneration, lumbar region without mention of lumbar back pain or lower extremity pain: M51.369

## 2024-06-29 HISTORY — DX: Dorsalgia, unspecified: M54.9

## 2024-06-29 HISTORY — DX: Migraine, unspecified, not intractable, without status migrainosus: G43.909

## 2024-06-29 HISTORY — DX: Benign neoplasm of cerebral meninges: D32.0

## 2024-06-29 HISTORY — DX: Presence of (intrauterine) contraceptive device: Z97.5

## 2024-06-29 HISTORY — DX: Plantar fascial fibromatosis: M72.2

## 2024-06-29 MED ORDER — METHYLPREDNISOLONE 4 MG PO TBPK: ORAL_TABLET | ORAL | 0 refills | Status: DC

## 2024-06-29 MED ORDER — TRAMADOL HCL 50 MG PO TABS: 50.0000 mg | ORAL_TABLET | Freq: Four times a day (QID) | ORAL | 0 refills | Status: DC | PRN

## 2024-06-29 MED ORDER — CYCLOBENZAPRINE HCL 5 MG PO TABS: 5.0000 mg | ORAL_TABLET | Freq: Three times a day (TID) | ORAL | 0 refills | Status: DC | PRN

## 2024-06-29 MED ORDER — KETOROLAC TROMETHAMINE 30 MG/ML IJ SOLN
30.0000 mg | Freq: Once | INTRAMUSCULAR | Status: AC
  Administered 2024-06-29: 30 mg via INTRAMUSCULAR

## 2024-06-29 NOTE — ED Provider Notes (Signed)
 [facility]    CSN: [id number] Arrival date & time: 06/29/24  1042      History   Chief Complaint Chief Complaint  Patient presents with   Back Pain    HPI Tracey Little is a 53 y.o. female.   HPI  Patient states she has a history of intermittent back pain.  She has a history of bulging lumbar disks.  She states she moved some furniture a week ago and has been having back pain ever since then.  Bilaterally in the low back area.  No radiation.  No numbness or weakness.  No fall or trauma.  She has been taking ibuprofen and a leftover muscle relaxer.  This has not helped.  Past Medical History:  Diagnosis Date   Back pain    Bulging lumbar disc    Cerebellar meningioma (HCC)    Elbow pain    Hyperlipidemia    IUD (intrauterine device) in place    Migraines    Plantar fasciitis    Type 2 diabetes mellitus (HCC)     There are no active problems to display for this patient.   Past Surgical History:  Procedure Laterality Date   APPENDECTOMY     BREAST SURGERY     CHOLECYSTECTOMY     KNEE SURGERY      OB History   No obstetric history on file.      Home Medications    Prior to Admission medications   Medication Sig Start Date End Date Taking? Authorizing Provider  cyclobenzaprine  (FLEXERIL ) 5 MG tablet Take 1 tablet (5 mg total) by mouth 3 (three) times daily as needed for muscle spasms. 06/29/24  Yes Maranda Jamee Jacob, MD  ezetimibe (ZETIA) 10 MG tablet Take 10 mg by mouth daily.   Yes [provider]  methylPREDNISolone  (MEDROL  DOSEPAK) 4 MG TBPK tablet tad 06/29/24  Yes Maranda Jamee Jacob, MD  omeprazole (PRILOSEC) 20 MG capsule Take 20 mg by mouth daily.   Yes [provider]  topiramate (TOPAMAX) 100 MG tablet Take 100 mg by mouth 2 (two) times daily.   Yes [provider]  traMADol (ULTRAM) 50 MG tablet Take 1 tablet (50 mg total) by mouth every 6 (six) hours as needed. 06/29/24  Yes Maranda Jamee Jacob, MD   aspirin 81 MG chewable tablet Chew daily by mouth.    [provider]  atorvastatin (LIPITOR) 20 MG tablet Take 20 mg by mouth daily.    [provider]  cholecalciferol (VITAMIN D) 1000 units tablet Take 1,000 Units daily by mouth.    [provider]  clonazePAM (KLONOPIN) 1 MG tablet Take 1 mg 2 (two) times daily by mouth.    [provider]  ibuprofen (ADVIL,MOTRIN) 200 MG tablet Take 200 mg every 6 (six) hours as needed by mouth.    [provider]  sertraline (ZOLOFT) 50 MG tablet Take 50 mg by mouth daily.    [provider]    Family History Family History  Problem Relation Age of Onset   Diabetes Mother    Hyperlipidemia Mother    Hypertension Mother    Diabetes Father    Heart failure Father    Heart attack Father    Diabetes Sister    Hyperlipidemia Sister    Hypertension Sister    Diabetes Brother    Hyperlipidemia Brother    Hypertension Brother     Social History Social History   Tobacco Use   Smoking status: Every  Day    Current packs/day: 1.00    Types: Cigarettes   Smokeless tobacco: Never  Vaping Use   Vaping status: Never Used  Substance Use Topics   Alcohol use: No   Drug use: No     Allergies   Patient has no known allergies.   Review of Systems Review of Systems See HPI  Physical Exam Triage Vital Signs ED Triage Vitals  Encounter Vitals Group     BP 06/29/24 1047 106/70     Girls Systolic BP Percentile --      Girls Diastolic BP Percentile --      Boys Systolic BP Percentile --      Boys Diastolic BP Percentile --      Pulse Rate 06/29/24 1047 77     Resp 06/29/24 1047 16     Temp 06/29/24 1047 97.9 F (36.6 C)     Temp src --      SpO2 06/29/24 1047 96 %     Weight --      Height --      Head Circumference --      Peak Flow --      Pain Score 06/29/24 1050 6     Pain Loc --      Pain Education --      Exclude from Growth Chart --    No data found.  Updated Vital  Signs BP 106/70   Pulse 77   Temp 97.9 F (36.6 C)   Resp 16   SpO2 96%      Physical Exam Constitutional:      General: She is in acute distress.     Appearance: She is well-developed and normal weight.     Comments: Patient is uncomfortable due to pain.  Guarded movements  HENT:     Head: Normocephalic and atraumatic.  Eyes:     Conjunctiva/sclera: Conjunctivae normal.     Pupils: Pupils are equal, round, and reactive to light.  Cardiovascular:     Rate and Rhythm: Normal rate.  Pulmonary:     Effort: Pulmonary effort is normal. No respiratory distress.  Musculoskeletal:        General: Tenderness present. Normal range of motion.     Cervical back: Normal range of motion.     Comments: Tenderness bilaterally over the SI joints.  No palpable muscle spasm.  Slow but full range of motion.  Skin:    General: Skin is warm and dry.  Neurological:     General: No focal deficit present.     Mental Status: She is alert.     Comments: Strength sensation range of motion and reflexes are intact in both lower extremities with a negative straight leg raise      UC Treatments / Results  Labs (all labs ordered are listed, but only abnormal results are displayed) Labs Reviewed - No data to display  EKG   Radiology No results found.  Procedures Procedures (including critical care time)  Medications Ordered in UC Medications  ketorolac (TORADOL) 30 MG/ML injection 30 mg (30 mg Intramuscular Given 06/29/24 1132)    Initial Impression / Assessment and Plan / UC Course  I have reviewed the triage vital signs and the nursing notes.  Pertinent labs & imaging results that were available during my care of the patient were reviewed by me and considered in my medical decision making (see chart for details).     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  Take all of day 1 today May take Flexeril  as needed as muscle relaxer.  This may cause drowsiness Take tramadol if needed for pain.  Do not drive on tramadol Decrease activity while back is painful.  Avoid bending and lifting See your doctor if not improving by next week   ED Prescriptions     Medication Sig Dispense Auth. Provider   methylPREDNISolone  (MEDROL  DOSEPAK) 4 MG TBPK tablet tad 21 tablet Maranda Jamee Jacob, MD   traMADol (ULTRAM) 50 MG tablet Take 1 tablet (50 mg total) by mouth every 6 (six) hours as needed. 15 tablet Maranda Jamee Jacob, MD   cyclobenzaprine  (FLEXERIL ) 5 MG tablet Take 1 tablet (5 mg total) by mouth 3 (three) times daily as needed for muscle spasms. 30 tablet Maranda Jamee Jacob, MD      I have reviewed the PDMP during this encounter.   Maranda Jamee Jacob, MD 06/29/24 1230

## 2024-06-29 NOTE — ED Triage Notes (Signed)
 Back pain x 1 week, lower. Moved a lot of furniture last Saturday and thinks she aggravated her bulging disc. Has been taking muscle relaxers at night but they haven't helped much. Has had ibuprofen as well during the day.

## 2024-06-29 NOTE — Discharge Instructions (Signed)
 Take the Medrol  dose pack as prescribed.  Take all of day 1 today May take Flexeril  as needed as muscle relaxer.  This may cause drowsiness Take tramadol if needed for pain.  Do not drive on tramadol Decrease activity while back is painful.  Avoid bending and lifting See your doctor if not improving by next week

## 2024-08-14 ENCOUNTER — Ambulatory Visit
Admission: EM | Admit: 2024-08-14 | Discharge: 2024-08-14 | Disposition: A | Discharge status: Home / Self Care | Attending: Family Medicine | Admitting: Family Medicine

## 2024-08-14 ENCOUNTER — Other Ambulatory Visit: Payer: Self-pay

## 2024-08-14 DIAGNOSIS — J019 Acute sinusitis, unspecified: Secondary | ICD-10-CM

## 2024-08-14 MED ORDER — DOXYCYCLINE HYCLATE 100 MG PO CAPS: 100.0000 mg | ORAL_CAPSULE | Freq: Two times a day (BID) | ORAL | 0 refills | Status: AC

## 2024-08-14 MED ORDER — PREDNISONE 20 MG PO TABS: 40.0000 mg | ORAL_TABLET | Freq: Every day | ORAL | 0 refills | Status: AC

## 2024-08-14 MED ORDER — METHYLPREDNISOLONE SODIUM SUCC 125 MG IJ SOLR
80.0000 mg | Freq: Once | INTRAMUSCULAR | Status: AC
  Administered 2024-08-14: 80 mg via INTRAMUSCULAR

## 2024-08-14 NOTE — ED Triage Notes (Signed)
 Pt presenting with c/o headache, chest congestion, runny nose, tiredness, productive cough  with greenish phlegm x 2 weeks. Pt stated that she has been using Mucinex DM, last taken yesterday with minimal effectiveness.

## 2024-08-14 NOTE — Discharge Instructions (Signed)
 Take the doxycycline  2 times a day with food.  This is an antibiotic Take prednisone  once a day for 5 days.  Start the prednisone  tomorrow morning.  You had a shot of steroid tonight Make sure you are drinking lots of fluids Try to get enough rest See your doctor if not improving by next week

## 2024-08-14 NOTE — ED Provider Notes (Signed)
 [facility]    CSN: [id number] Arrival date & time: 08/14/24  1742      History   Chief Complaint Chief Complaint  Patient presents with   Headache    HPI Tracey Little is a 53 y.o. female.   HPI  Patient is here for an upper respiratory infection.  She is a smoker.  She has head congestion, sinus congestion, nasal pressure and pain, sore throat, intermittent hoarse voice, fatigue, cough and chest congestion for 2 weeks.  Not responding to over-the-counter medicines.  Past Medical History:  Diagnosis Date   Back pain    Bulging lumbar disc    Cerebellar meningioma (HCC)    Elbow pain    Hyperlipidemia    IUD (intrauterine device) in place    Migraines    Plantar fasciitis    Type 2 diabetes mellitus (HCC)     There are no active problems to display for this patient.   Past Surgical History:  Procedure Laterality Date   APPENDECTOMY     BREAST SURGERY     CHOLECYSTECTOMY     KNEE SURGERY      OB History   No obstetric history on file.      Home Medications    Prior to Admission medications   Medication Sig Start Date End Date Taking? Authorizing Provider  doxycycline  (VIBRAMYCIN ) 100 MG capsule Take 1 capsule (100 mg total) by mouth 2 (two) times daily. 08/14/24  Yes Maranda Jamee Jacob, MD  predniSONE  (DELTASONE ) 20 MG tablet Take 2 tablets (40 mg total) by mouth daily with breakfast. 08/14/24  Yes Maranda Jamee Jacob, MD  aspirin 81 MG chewable tablet Chew daily by mouth.    [provider]  atorvastatin (LIPITOR) 20 MG tablet Take 20 mg by mouth daily.    [provider]  cholecalciferol (VITAMIN D) 1000 units tablet Take 1,000 Units daily by mouth.    [provider]  clonazePAM (KLONOPIN) 1 MG tablet Take 1 mg 2 (two) times daily by mouth.    [provider]  ezetimibe (ZETIA) 10 MG tablet Take 10 mg by mouth daily.    [provider]  ibuprofen (ADVIL,MOTRIN) 200 MG tablet Take 200 mg every  6 (six) hours as needed by mouth.    [provider]  omeprazole (PRILOSEC) 20 MG capsule Take 20 mg by mouth daily.    [provider]  sertraline (ZOLOFT) 50 MG tablet Take 50 mg by mouth daily.    [provider]  topiramate (TOPAMAX) 100 MG tablet Take 100 mg by mouth 2 (two) times daily.    [provider]    Family History Family History  Problem Relation Age of Onset   Diabetes Mother    Hyperlipidemia Mother    Hypertension Mother    Diabetes Father    Heart failure Father    Heart attack Father    Diabetes Sister    Hyperlipidemia Sister    Hypertension Sister    Diabetes Brother    Hyperlipidemia Brother    Hypertension Brother     Social History Social History   Tobacco Use   Smoking status: Every Day    Current packs/day: 1.00    Types: Cigarettes   Smokeless tobacco: Never  Vaping Use   Vaping status: Never Used  Substance Use Topics   Alcohol use: No   Drug use: No     Allergies   Patient has no known allergies.   Review of  Systems Review of Systems  See HPI Physical Exam Triage Vital Signs ED Triage Vitals  Encounter Vitals Group     BP 08/14/24 1757 127/67     Girls Systolic BP Percentile --      Girls Diastolic BP Percentile --      Boys Systolic BP Percentile --      Boys Diastolic BP Percentile --      Pulse Rate 08/14/24 1757 74     Resp 08/14/24 1757 18     Temp 08/14/24 1757 98.7 F (37.1 C)     Temp Source 08/14/24 1757 Oral     SpO2 08/14/24 1757 96 %     Weight 08/14/24 1759 140 lb (63.5 kg)     Height 08/14/24 1759 5' 5 (1.651 m)     Head Circumference --      Peak Flow --      Pain Score 08/14/24 1759 2     Pain Loc --      Pain Education --      Exclude from Growth Chart --    No data found.  Updated Vital Signs BP 127/67 (BP Location: Right Arm)   Pulse 74   Temp 98.7 F (37.1 C) (Oral)   Resp 18   Ht 5' 5 (1.651 m)   Wt 63.5 kg   SpO2 96%   BMI 23.30 kg/m        Physical Exam Constitutional:      General: She is not in acute distress.    Appearance: She is well-developed and normal weight. She is ill-appearing.     Comments: Appears ill.  Appears tired  HENT:     Head: Normocephalic and atraumatic.     Right Ear: Tympanic membrane normal.     Left Ear: Tympanic membrane normal.     Nose: Congestion and rhinorrhea present.     Mouth/Throat:     Pharynx: Posterior oropharyngeal erythema present.  Eyes:     Conjunctiva/sclera: Conjunctivae normal.     Pupils: Pupils are equal, round, and reactive to light.  Cardiovascular:     Rate and Rhythm: Normal rate and regular rhythm.     Heart sounds: Normal heart sounds.  Pulmonary:     Effort: Pulmonary effort is normal. No respiratory distress.     Breath sounds: Rhonchi present.  Musculoskeletal:        General: Normal range of motion.     Cervical back: Normal range of motion.  Lymphadenopathy:     Cervical: Cervical adenopathy present.  Skin:    General: Skin is warm and dry.  Neurological:     Mental Status: She is alert.      UC Treatments / Results  Labs (all labs ordered are listed, but only abnormal results are displayed) Labs Reviewed - No data to display  EKG   Radiology No results found.  Procedures Procedures (including critical care time)  Medications Ordered in UC Medications  methylPREDNISolone  sodium succinate (SOLU-MEDROL ) 125 mg/2 mL injection 80 mg (80 mg Intramuscular Given 08/14/24 1838)    Initial Impression / Assessment and Plan / UC Course  I have reviewed the triage vital signs and the nursing notes.  Pertinent labs & imaging results that were available during my care of the patient were reviewed by me and considered in my medical decision making (see chart for details).    Patient desires injection.  Has had good results in the past Final Clinical Impressions(s) / UC Diagnoses  Final diagnoses:  Acute sinusitis with symptoms greater than 10  days     Discharge Instructions      Take the doxycycline  2 times a day with food.  This is an antibiotic Take prednisone  once a day for 5 days.  Start the prednisone  tomorrow morning.  You had a shot of steroid tonight Make sure you are drinking lots of fluids Try to get enough rest See your doctor if not improving by next week   ED Prescriptions     Medication Sig Dispense Auth. Provider   predniSONE  (DELTASONE ) 20 MG tablet Take 2 tablets (40 mg total) by mouth daily with breakfast. 10 tablet Maranda Jamee Jacob, MD   doxycycline  (VIBRAMYCIN ) 100 MG capsule Take 1 capsule (100 mg total) by mouth 2 (two) times daily. 20 capsule Maranda Jamee Jacob, MD      PDMP not reviewed this encounter.   Maranda Jamee Jacob, MD 08/14/24 [phone]
